# Patient Record
Sex: Male | Born: 2000 | Race: Black or African American | Hispanic: No | Marital: Single
Health system: Southern US, Community
[De-identification: ages and names within clinical notes are randomized; demographics above are authoritative.]

## PROBLEM LIST (undated history)

## (undated) DIAGNOSIS — J45909 Unspecified asthma, uncomplicated: Secondary | ICD-10-CM

## (undated) HISTORY — PX: OTHER SURGICAL HISTORY: SHX169

## (undated) HISTORY — PX: NECK SURGERY: SHX720

## (undated) HISTORY — PX: FINGER FRACTURE SURGERY: SHX638

---
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
## 2020-02-16 ENCOUNTER — Emergency Department (HOSPITAL_COMMUNITY)
Admission: EM | Admit: 2020-02-16 | Discharge: 2020-02-17 | Disposition: A | Discharge status: Home / Self Care | Payer: Medicaid Other | Attending: Emergency Medicine | Admitting: Emergency Medicine

## 2020-02-16 ENCOUNTER — Encounter (HOSPITAL_COMMUNITY): Payer: Self-pay

## 2020-02-16 ENCOUNTER — Other Ambulatory Visit: Payer: Self-pay

## 2020-02-16 DIAGNOSIS — Z7951 Long term (current) use of inhaled steroids: Secondary | ICD-10-CM | POA: Insufficient documentation

## 2020-02-16 DIAGNOSIS — Z7722 Contact with and (suspected) exposure to environmental tobacco smoke (acute) (chronic): Secondary | ICD-10-CM | POA: Insufficient documentation

## 2020-02-16 DIAGNOSIS — J45909 Unspecified asthma, uncomplicated: Secondary | ICD-10-CM | POA: Diagnosis not present

## 2020-02-16 DIAGNOSIS — R300 Dysuria: Secondary | ICD-10-CM | POA: Diagnosis not present

## 2020-02-16 DIAGNOSIS — R369 Urethral discharge, unspecified: Secondary | ICD-10-CM

## 2020-02-16 NOTE — ED Provider Notes (Signed)
[facility]-EMERGENC[facility] DEPT Provider Note   CSN: [id number] Arrival date & time: 02/16/20  2140     History Chief Complaint  Patient presents with  . Dysuria    Tommy Ledin. is a 19 y.o. male.  Tommy Cain. is a 19 y.o. male with history of asthma, otherwise healthy, who presents to the emergency department for evaluation of dysuria.  He reports yesterday morning he noticed some burning with urination that lasted a few seconds after urinating, he had symptoms again this morning.  He mentioned symptoms to his mom who brought him in for evaluation.  He has not noted any blood in his urine.  He thinks he may have seen some discharge in his underwear but is not sure.  Denies any bumps or lesions.  No testicular pain or swelling.  No abdominal pain, no nausea or vomiting, no fevers or chills.  No flank pain.  No history of UTI or kidney stones.  Patient states he is sexually active and recently had a new partner, but states that he does use condoms.  No prior history of STD.        Past Medical History:  Diagnosis Date  . Asthma   . Premature birth     There are no problems to display for this patient.   Past Surgical History:  Procedure Laterality Date  . FINGER FRACTURE SURGERY    . NECK SURGERY    . umbilical closure         Family History  Problem Relation Age of Onset  . Hypertension Father   . Kidney failure Father   . Hypertension Other   . Cancer Other   . Diabetes Other   . Kidney failure Other     Social History   Tobacco Use  . Smoking status: Passive Smoke Exposure - Never Smoker  . Smokeless tobacco: Never Used  Substance Use Topics  . Alcohol use: No  . Drug use: No    Home Medications Prior to Admission medications   Medication Sig Start Date End Date Taking? Authorizing Provider  albuterol (PROVENTIL HFA;VENTOLIN HFA) 108 (90 Base) MCG/ACT inhaler Inhale 2 puffs into the lungs every 4 (four) hours as  needed for wheezing or shortness of breath. 12/31/17   Harris, Cammy Copa, PA-C  albuterol (PROVENTIL) (2.5 MG/3ML) 0.083% nebulizer solution Take 2.5 mg by nebulization every 6 (six) hours as needed for wheezing or shortness of breath.    [provider]  beclomethasone (QVAR) 40 MCG/ACT inhaler Inhale 2 puffs into the lungs 2 (two) times daily. 12/31/17   Harris, Cammy Copa, PA-C  cyclobenzaprine (FLEXERIL) 10 MG tablet Take 1 tablet (10 mg total) by mouth 3 (three) times daily as needed for muscle spasms. 04/25/18   Street, [place], PA-C  diphenhydrAMINE (BENADRYL) 25 MG tablet Take 1 tablet (25 mg total) by mouth every 6 (six) hours as needed for itching (Rash). 01/16/15   Emilia Beck, PA-C  doxycycline (VIBRAMYCIN) 100 MG capsule Take 1 capsule (100 mg total) by mouth 2 (two) times daily. Fill and take prescription only if you test positive for chlamydia. 02/17/20   Dartha Lodge, PA-C  fluticasone (FLONASE) 50 MCG/ACT nasal spray Place 2 sprays into both nostrils daily. 12/31/17   Harris, Cammy Copa, PA-C  ibuprofen (ADVIL,MOTRIN) 800 MG tablet Take 1 tablet (800 mg total) by mouth every 8 (eight) hours as needed. 08/02/15   Lawyer, Cristal Deer, PA-C  levocetirizine (XYZAL) 5 MG tablet One tablet by mouth  daily before bed. 12/31/17   Harris, Cammy Copa, PA-C  naproxen (NAPROSYN) 500 MG tablet Take 1 tablet (500 mg total) by mouth 2 (two) times daily as needed for mild pain, moderate pain or headache (TAKE WITH MEALS.). 04/25/18   Street, [place], [place]    Allergies    Patient has no known allergies.  Review of Systems   Review of Systems  Constitutional: Negative for chills and fever.  Gastrointestinal: Negative for abdominal pain, nausea and vomiting.  Genitourinary: Positive for discharge and dysuria. Negative for flank pain, frequency, genital sores, hematuria, penile pain, scrotal swelling and testicular pain.  Musculoskeletal: Negative for arthralgias and myalgias.  Skin: Negative for  rash.  All other systems reviewed and are negative.   Physical Exam Updated Vital Signs BP (!) 145/77 (BP Location: Left Arm)   Pulse 94   Temp 98.2 F (36.8 C) (Oral)   Resp 17   Ht 6\' 2"  (1.88 m)   Wt 73 kg   SpO2 100%   BMI 20.67 kg/m   Physical Exam Vitals and nursing note reviewed. Exam conducted with a chaperone present.  Constitutional:      General: He is not in acute distress.    Appearance: Normal appearance. He is well-developed. He is not ill-appearing or diaphoretic.     Comments: Well-appearing and in no distress  HENT:     Head: Normocephalic and atraumatic.  Eyes:     General:        Right eye: No discharge.        Left eye: No discharge.  Cardiovascular:     Rate and Rhythm: Normal rate and regular rhythm.     Heart sounds: Normal heart sounds.  Pulmonary:     Effort: Pulmonary effort is normal. No respiratory distress.     Breath sounds: Normal breath sounds.  Abdominal:     General: Abdomen is flat. Bowel sounds are normal. There is no distension.     Palpations: Abdomen is soft. There is no mass.     Tenderness: There is no abdominal tenderness. There is no guarding.     Comments: Abdomen soft, nondistended, nontender to palpation in all quadrants without guarding or peritoneal signs No CVA tenderness bilaterally  Genitourinary:    Comments: Chaperone present during genital exam No inguinal lymphadenopathy noted, no genital lesions noted. Penis normal, there is yellow discharge noted at the urinary meatus No testicular pain, swelling or masses, no tenderness over the epididymis or spermatic cord. Musculoskeletal:        General: No deformity.  Skin:    General: Skin is warm and dry.  Neurological:     Mental Status: He is alert and oriented to person, place, and time.     Coordination: Coordination normal.  Psychiatric:        Mood and Affect: Mood normal.        Behavior: Behavior normal.     ED Results / Procedures / Treatments    Labs (all labs ordered are listed, but only abnormal results are displayed) Labs Reviewed  URINALYSIS, ROUTINE W REFLEX MICROSCOPIC - Abnormal; Notable for the following components:      Result Value   APPearance CLOUDY (*)    Protein, ur 100 (*)    Leukocytes,Ua LARGE (*)    Bacteria, UA RARE (*)    All other components within normal limits  URINE CULTURE  GC/CHLAMYDIA PROBE AMP ([facility]) NOT AT [facility]    EKG None  Radiology No results found.  Procedures Procedures (including critical care time)  Medications Ordered in ED Medications  cefTRIAXone (ROCEPHIN) injection 500 mg (has no administration in time range)  lidocaine (PF) (XYLOCAINE) 1 % injection 1 mL (has no administration in time range)    ED Course  I have reviewed the triage vital signs and the nursing notes.  Pertinent labs & imaging results that were available during my care of the patient were reviewed by me and considered in my medical decision making (see chart for details).    MDM Rules/Calculators/A&P                          Patient is afebrile without abdominal tenderness, abdominal pain or painful bowel movements to indicate prostatitis.  No tenderness to palpation of the testes or epididymis to suggest orchitis or epididymitis.  Urinalysis with leukocytes and WBCs but rare bacteria, and with yellow discharge noted on exam suspect this is more in the setting of STD, patient does not have history of prior UTI.  GC and Chlamydia testing is pending.  Patient to be discharged with instructions to follow up with PCP. Discussed importance of using protection when sexually active. Pt understands that they have GC/Chlamydia cultures pending and that they will need to inform all sexual partners if results return positive. Patient has been treated prophylactically with Rocephin and prescribed doxycycline, he has been instructed to fill and take this only if he test positive for chlamydia.    Final Clinical  Impression(s) / ED Diagnoses Final diagnoses:  Dysuria  Penile discharge    Rx / DC Orders ED Discharge Orders         Ordered    doxycycline (VIBRAMYCIN) 100 MG capsule  2 times daily        02/17/20 0049           Dartha Lodge, PA-C 02/17/20 0055    Marily Memos, MD 02/17/20 [phone]

## 2020-02-16 NOTE — ED Triage Notes (Signed)
Pt sts burning when urinating. Began yesterday morning.

## 2020-02-17 LAB — URINALYSIS, ROUTINE W REFLEX MICROSCOPIC
Bilirubin Urine: NEGATIVE
Glucose, UA: NEGATIVE mg/dL
Hgb urine dipstick: NEGATIVE
Ketones, ur: NEGATIVE mg/dL
Nitrite: NEGATIVE
Protein, ur: 100 mg/dL — AB
Specific Gravity, Urine: 1.03 (ref 1.005–1.030)
pH: 7 (ref 5.0–8.0)

## 2020-02-17 MED ORDER — CEFTRIAXONE SODIUM 1 G IJ SOLR
500.0000 mg | Freq: Once | INTRAMUSCULAR | Status: AC
  Administered 2020-02-17: 500 mg via INTRAMUSCULAR
  Filled 2020-02-17: qty 10

## 2020-02-17 MED ORDER — LIDOCAINE HCL (PF) 1 % IJ SOLN
1.0000 mL | Freq: Once | INTRAMUSCULAR | Status: AC
  Administered 2020-02-17: 1 mL
  Filled 2020-02-17: qty 30

## 2020-02-17 MED ORDER — DOXYCYCLINE HYCLATE 100 MG PO CAPS: 100.0000 mg | ORAL_CAPSULE | Freq: Two times a day (BID) | ORAL | 0 refills | Status: AC

## 2020-02-17 NOTE — Discharge Instructions (Signed)
You were tested today for gonorrhea and chlamydia.  You have already been treated for possible gonorrhea, but if you are testing comes back for chlamydia you should fill and take doxycycline twice daily for the next 7 days, it is very important that you complete entire prescription. You will be called in the next 2 to 3 days if any results are positive, you can also review negative results online.  You will need to notify any partners of positive results so that they can be tested and treated as well.  You should avoid sexual activity until you have completed all antibiotics.  Please make sure you are using protection when sexually active to help prevent further STDs.  You can go to the health department or your PCP for any future STD testing needs.

## 2020-02-18 ENCOUNTER — Telehealth: Payer: Self-pay

## 2020-02-18 LAB — URINE CULTURE: Culture: <10000 — AB

## 2020-02-18 NOTE — Telephone Encounter (Signed)
Transition Care Management Follow-up Telephone Call  Date of discharge and from where: 02/17/2020 [facility]  How have you been since you were released from the hospital? Doing well.   Any questions or concerns? No  Items Reviewed:  Did the pt receive and understand the discharge instructions provided? Yes   Medications obtained and verified? Awaiting lab results before picking up medications.   Other? No   Any new allergies since your discharge? No   Dietary orders reviewed? Yes  Do you have support at home? Yes   Home Care and Equipment/Supplies: Were home health services ordered? not applicable If so, what is the name of the agency? na  Has the agency set up a time to come to the patient's home? not applicable Were any new equipment or medical supplies ordered?  No What is the name of the medical supply agency? na Were you able to get the supplies/equipment? not applicable Do you have any questions related to the use of the equipment or supplies? No  Functional Questionnaire: (I = Independent and D = Dependent) ADLs: I  Bathing/Dressing- I  Meal Prep- I  Eating- I  Maintaining continence- I  Transferring/Ambulation- I  Managing Meds- I  Follow up appointments reviewed:   PCP Hospital f/u appt confirmed? No  Patient stated he will await call from Nurse with peding results before making appointment with a PCP office.  Specialist Hospital f/u appt confirmed? No    Are transportation arrangements needed? No   If their condition worsens, is the pt aware to call PCP or go to the Emergency Dept.? Yes  Was the patient provided with contact information for the PCP's office or ED? Yes  Was to pt encouraged to call back with questions or concerns? Yes

## 2022-09-11 DIAGNOSIS — S00262A Insect bite (nonvenomous) of left eyelid and periocular area, initial encounter: Secondary | ICD-10-CM | POA: Diagnosis not present

## 2022-11-03 ENCOUNTER — Emergency Department (HOSPITAL_COMMUNITY)
Admission: EM | Admit: 2022-11-03 | Discharge: 2022-11-03 | Disposition: A | Discharge status: Home / Self Care | Payer: Medicaid Other | Attending: Emergency Medicine | Admitting: Emergency Medicine

## 2022-11-03 ENCOUNTER — Encounter (HOSPITAL_COMMUNITY): Payer: Self-pay

## 2022-11-03 ENCOUNTER — Other Ambulatory Visit: Payer: Self-pay

## 2022-11-03 DIAGNOSIS — R112 Nausea with vomiting, unspecified: Secondary | ICD-10-CM | POA: Insufficient documentation

## 2022-11-03 DIAGNOSIS — R197 Diarrhea, unspecified: Secondary | ICD-10-CM | POA: Insufficient documentation

## 2022-11-03 DIAGNOSIS — R109 Unspecified abdominal pain: Secondary | ICD-10-CM | POA: Insufficient documentation

## 2022-11-03 LAB — CBC
HCT: 46.1 % (ref 39.0–52.0)
Hemoglobin: 15.7 g/dL (ref 13.0–17.0)
MCH: 30.8 pg (ref 26.0–34.0)
MCHC: 34.1 g/dL (ref 30.0–36.0)
MCV: 90.6 fL (ref 80.0–100.0)
Platelets: 206 10*3/uL (ref 150–400)
RBC: 5.09 MIL/uL (ref 4.22–5.81)
RDW: 12.8 % (ref 11.5–15.5)
WBC: 7 10*3/uL (ref 4.0–10.5)
nRBC: 0 % (ref 0.0–0.2)

## 2022-11-03 LAB — URINALYSIS, ROUTINE W REFLEX MICROSCOPIC
Bilirubin Urine: NEGATIVE
Glucose, UA: NEGATIVE mg/dL
Hgb urine dipstick: NEGATIVE
Ketones, ur: 5 mg/dL — AB
Leukocytes,Ua: NEGATIVE
Nitrite: NEGATIVE
Protein, ur: NEGATIVE mg/dL
Specific Gravity, Urine: 1.024 (ref 1.005–1.030)
pH: 6 (ref 5.0–8.0)

## 2022-11-03 LAB — COMPREHENSIVE METABOLIC PANEL
ALT: 14 U/L (ref 0–44)
AST: 20 U/L (ref 15–41)
Albumin: 4.4 g/dL (ref 3.5–5.0)
Alkaline Phosphatase: 54 U/L (ref 38–126)
Anion gap: 7 (ref 5–15)
BUN: 15 mg/dL (ref 6–20)
CO2: 28 mmol/L (ref 22–32)
Calcium: 9.1 mg/dL (ref 8.9–10.3)
Chloride: 102 mmol/L (ref 98–111)
Creatinine, Ser: 1.05 mg/dL (ref 0.61–1.24)
GFR, Estimated: >60 mL/min (ref >60)
Glucose, Bld: 128 mg/dL — ABNORMAL HIGH (ref 70–99)
Potassium: 3.4 mmol/L — ABNORMAL LOW (ref 3.5–5.1)
Sodium: 137 mmol/L (ref 135–145)
Total Bilirubin: 0.6 mg/dL (ref 0.3–1.2)
Total Protein: 7.2 g/dL (ref 6.5–8.1)

## 2022-11-03 LAB — LIPASE, BLOOD: Lipase: 27 U/L (ref 11–51)

## 2022-11-03 MED ORDER — ONDANSETRON HCL 4 MG/2ML IJ SOLN
4.0000 mg | Freq: Once | INTRAMUSCULAR | Status: AC
  Administered 2022-11-03: 4 mg via INTRAVENOUS
  Filled 2022-11-03: qty 2

## 2022-11-03 MED ORDER — LACTATED RINGERS IV BOLUS
1000.0000 mL | Freq: Once | INTRAVENOUS | Status: AC
  Administered 2022-11-03: 1000 mL via INTRAVENOUS

## 2022-11-03 MED ORDER — KETOROLAC TROMETHAMINE 30 MG/ML IJ SOLN
30.0000 mg | Freq: Once | INTRAMUSCULAR | Status: AC
  Administered 2022-11-03: 30 mg via INTRAVENOUS
  Filled 2022-11-03: qty 1

## 2022-11-03 MED ORDER — ONDANSETRON 4 MG PO TBDP: 4.0000 mg | ORAL_TABLET | Freq: Three times a day (TID) | ORAL | 0 refills | Status: AC | PRN

## 2022-11-03 NOTE — ED Triage Notes (Signed)
Pt. Arrives POV c/o nausea and diarrhea since 2am. Pt. States that he tried to vomit once, but nothing came up. Pt. Has been able to eat throughout the day.

## 2022-11-03 NOTE — ED Provider Notes (Signed)
[facility]-EMERGENCY DEPT [facility] Emergency Department Provider Note MRN:  [id number]  Arrival date & time: 11/03/22     Chief Complaint   Abdominal Pain   History of Present Illness   Tommy Denaro. is a 22 y.o. year-old male presents to the ED with chief complaint of abdominal cramps, nausea, vomiting, diarrhea.  He denies any fevers or chills.  Denies any dysuria or hematuria.  He denies any recent sick contacts.  He associates his symptoms with what he ate yesterday.  He reports 4 out of 10 crampy abdominal pain.  He has been able to eat some food today.  History provided by patient.   Review of Systems  Pertinent positive and negative review of systems noted in HPI.    Physical Exam   Vitals:   11/03/22 2011 11/03/22 2316  BP: (!) 139/93 132/72  Pulse: (!) 54 (!) 59  Resp: 15 15  Temp: 98.3 F (36.8 C) 97.7 F (36.5 C)  SpO2: 100% 100%    CONSTITUTIONAL:  well-appearing, NAD NEURO:  Alert and oriented x 3, CN 3-12 grossly intact EYES:  eyes equal and reactive ENT/NECK:  Supple, no stridor  CARDIO:  normal rate, regular rhythm, appears well-perfused  PULM:  No respiratory distress, CTAB GI/GU:  non-distended, no focal abdominal tenderness, no RLQ tenderness or pain at McBurney's, no Murphy sign MSK/SPINE:  No gross deformities, no edema, moves all extremities  SKIN:  no rash, atraumatic   *Additional and/or pertinent findings included in MDM below  Diagnostic and Interventional Summary    EKG Interpretation Date/Time:    Ventricular Rate:    PR Interval:    QRS Duration:    QT Interval:    QTC Calculation:   R Axis:      Text Interpretation:         Labs Reviewed  COMPREHENSIVE METABOLIC PANEL - Abnormal; Notable for the following components:      Result Value   Potassium 3.4 (*)    Glucose, Bld 128 (*)    All other components within normal limits  URINALYSIS, ROUTINE W REFLEX MICROSCOPIC - Abnormal; Notable for the following  components:   APPearance CLOUDY (*)    Ketones, ur 5 (*)    All other components within normal limits  LIPASE, BLOOD  CBC    No orders to display    Medications  ketorolac (TORADOL) 30 MG/ML injection 30 mg (30 mg Intravenous Given 11/03/22 2238)  ondansetron (ZOFRAN) injection 4 mg (4 mg Intravenous Given 11/03/22 2237)  lactated ringers bolus 1,000 mL (0 mLs Intravenous Stopped 11/03/22 2347)     Procedures  /  Critical Care Procedures  ED Course and Medical Decision Making  I have reviewed the triage vital signs, the nursing notes, and pertinent available records from the EMR.  Social Determinants Affecting Complexity of Care: Patient has no clinically significant social determinants affecting this chief complaint..   ED Course:    Medical Decision Making Patient here with n/v/d and crampy abdominal pain for 1 day.  Attributes his symptoms to what he ate yesterday.  His blood work that was done in triage is reassuring.  His exam is unremarkable, no focal abdominal tenderness.  I have a low suspicion for acute abdomen.  Will treat with fluids, zofran, and toradol and reassess.  11:48 PM Patient reassessed, he is feeling much better.  Will discharge home with prescription for Zofran.  Return precautions discussed.  Risk Prescription drug management.  Consultants: No consultations were needed in caring for this patient.   Treatment and Plan: Emergency department workup does not suggest an emergent condition requiring admission or immediate intervention beyond  what has been performed at this time. The patient is safe for discharge and has  been instructed to return immediately for worsening symptoms, change in  symptoms or any other concerns    Final Clinical Impressions(s) / ED Diagnoses     ICD-10-CM   1. Nausea vomiting and diarrhea  R11.2    R19.7       ED Discharge Orders          Ordered    ondansetron (ZOFRAN-ODT) 4 MG disintegrating tablet   Every 8 hours PRN        11/03/22 2348              Discharge Instructions Discussed with and Provided to Patient:   Discharge Instructions   None      Roxy Horseman, PA-C 11/03/22 2348    Anders Simmonds T, DO 11/05/22 0017

## 2023-10-03 ENCOUNTER — Encounter: Payer: Self-pay | Admitting: Advanced Practice Midwife
